# Patient Record
Sex: Female | Born: 1995 | Race: White | Hispanic: No | Marital: Single | Smoking: Never smoker
Health system: Southern US, Community
[De-identification: ages and names within clinical notes are randomized; demographics above are authoritative.]

---
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
## 2014-04-06 ENCOUNTER — Encounter: Payer: BC Managed Care – HMO | Admitting: Internal Medicine

## 2014-09-06 ENCOUNTER — Other Ambulatory Visit: Payer: Self-pay | Admitting: Obstetrics and Gynecology

## 2014-09-07 ENCOUNTER — Other Ambulatory Visit: Payer: Self-pay | Admitting: Neurosurgery

## 2014-09-07 DIAGNOSIS — M545 Low back pain: Secondary | ICD-10-CM

## 2014-09-18 ENCOUNTER — Ambulatory Visit
Admission: RE | Admit: 2014-09-18 | Discharge: 2014-09-18 | Disposition: A | Discharge status: Home / Self Care | Payer: BLUE CROSS/BLUE SHIELD | Source: Ambulatory Visit | Attending: Neurosurgery | Admitting: Neurosurgery

## 2014-09-18 DIAGNOSIS — M545 Low back pain: Secondary | ICD-10-CM

## 2015-04-10 ENCOUNTER — Ambulatory Visit
Admission: RE | Admit: 2015-04-10 | Discharge: 2015-04-10 | Disposition: A | Discharge status: Home / Self Care | Payer: BLUE CROSS/BLUE SHIELD | Source: Ambulatory Visit | Attending: Family Medicine | Admitting: Family Medicine

## 2015-04-10 ENCOUNTER — Other Ambulatory Visit: Payer: Self-pay | Admitting: Family Medicine

## 2015-04-10 DIAGNOSIS — R05 Cough: Secondary | ICD-10-CM

## 2015-04-10 DIAGNOSIS — R059 Cough, unspecified: Secondary | ICD-10-CM

## 2015-09-04 ENCOUNTER — Encounter (HOSPITAL_COMMUNITY): Payer: Self-pay | Admitting: Emergency Medicine

## 2015-09-04 ENCOUNTER — Emergency Department (HOSPITAL_COMMUNITY)
Admission: EM | Admit: 2015-09-04 | Discharge: 2015-09-04 | Disposition: A | Discharge status: Home / Self Care | Payer: BLUE CROSS/BLUE SHIELD | Attending: Emergency Medicine | Admitting: Emergency Medicine

## 2015-09-04 DIAGNOSIS — Z792 Long term (current) use of antibiotics: Secondary | ICD-10-CM | POA: Insufficient documentation

## 2015-09-04 DIAGNOSIS — R319 Hematuria, unspecified: Secondary | ICD-10-CM | POA: Diagnosis present

## 2015-09-04 DIAGNOSIS — Z79899 Other long term (current) drug therapy: Secondary | ICD-10-CM | POA: Diagnosis not present

## 2015-09-04 DIAGNOSIS — N390 Urinary tract infection, site not specified: Secondary | ICD-10-CM | POA: Diagnosis not present

## 2015-09-04 DIAGNOSIS — R8281 Pyuria: Secondary | ICD-10-CM

## 2015-09-04 LAB — URINALYSIS, ROUTINE W REFLEX MICROSCOPIC
BILIRUBIN URINE: NEGATIVE
Glucose, UA: NEGATIVE mg/dL
Ketones, ur: NEGATIVE mg/dL
Nitrite: POSITIVE — AB
Protein, ur: NEGATIVE mg/dL
SPECIFIC GRAVITY, URINE: 1.023 (ref 1.005–1.030)
pH: 7 (ref 5.0–8.0)

## 2015-09-04 LAB — URINE MICROSCOPIC-ADD ON

## 2015-09-04 LAB — WET PREP, GENITAL
CLUE CELLS WET PREP: NONE SEEN
Sperm: NONE SEEN
Trich, Wet Prep: NONE SEEN
YEAST WET PREP: NONE SEEN

## 2015-09-04 LAB — PREGNANCY, URINE: PREG TEST UR: NEGATIVE

## 2015-09-04 MED ORDER — CEPHALEXIN 500 MG PO CAPS
500.0000 mg | ORAL_CAPSULE | Freq: Once | ORAL | Status: AC
  Administered 2015-09-04: 500 mg via ORAL
  Filled 2015-09-04: qty 1

## 2015-09-04 MED ORDER — CEPHALEXIN 500 MG PO CAPS: 500.0000 mg | ORAL_CAPSULE | Freq: Two times a day (BID) | ORAL | Status: AC

## 2015-09-04 NOTE — Progress Notes (Signed)
Patient listed as having [employer] insurance without a pcp.  EDCM spoke to patient at bedside.  Patient reports she is from [place].  Patient reports she does not have a pcp and will be going out of the country within a week and then [place].  EDCM instructed patient to contact her insurance company before/when traveling to be informed of what type of medical coverage will be covered.  Patient verbalized understanding.  No further EDCM needs at this time.

## 2015-09-04 NOTE — Discharge Instructions (Signed)
Your urine today did not show any blood. It is possible, however, to have blood with a urinary tract infection. This is from irritation to the wall/lining of your bladder. This should resolve when your infection improves. You are being treated for a urinary tract infection today. Take Keflex as prescribed every day until finished. You have also been tested for STDs. Follow-up with the health department or this hospital in 48 hours by phone to obtain the results of these tests. Do not engage in sexual intercourse until you are notified of your test results. If you are positive for STDs, you must refrain from sexual intercourse for one week following treatment. If positive, you should also notify all sexual partners of the need for them to be tested and treated for STDs as well. Return to the emergency department as needed if symptoms worsen.  Urinary Tract Infection Urinary tract infections (UTIs) can develop anywhere along your urinary tract. Your urinary tract is your body's drainage system for removing wastes and extra water. Your urinary tract includes two kidneys, two ureters, a bladder, and a urethra. Your kidneys are a pair of bean-shaped organs. Each kidney is about the size of your fist. They are located below your ribs, one on each side of your spine. CAUSES Infections are caused by microbes, which are microscopic organisms, including fungi, viruses, and bacteria. These organisms are so small that they can only be seen through a microscope. Bacteria are the microbes that most commonly cause UTIs. SYMPTOMS  Symptoms of UTIs may vary by age and gender of the patient and by the location of the infection. Symptoms in young women typically include a frequent and intense urge to urinate and a painful, burning feeling in the bladder or urethra during urination. Older women and men are more likely to be tired, shaky, and weak and have muscle aches and abdominal pain. A fever may mean the infection is in your  kidneys. Other symptoms of a kidney infection include pain in your back or sides below the ribs, nausea, and vomiting. DIAGNOSIS To diagnose a UTI, your caregiver will ask you about your symptoms. Your caregiver will also ask you to provide a urine sample. The urine sample will be tested for bacteria and white blood cells. White blood cells are made by your body to help fight infection. TREATMENT  Typically, UTIs can be treated with medication. Because most UTIs are caused by a bacterial infection, they usually can be treated with the use of antibiotics. The choice of antibiotic and length of treatment depend on your symptoms and the type of bacteria causing your infection. HOME CARE INSTRUCTIONS  If you were prescribed antibiotics, take them exactly as your caregiver instructs you. Finish the medication even if you feel better after you have only taken some of the medication.  Drink enough water and fluids to keep your urine clear or pale yellow.  Avoid caffeine, tea, and carbonated beverages. They tend to irritate your bladder.  Empty your bladder often. Avoid holding urine for long periods of time.  Empty your bladder before and after sexual intercourse.  After a bowel movement, women should cleanse from front to back. Use each tissue only once. SEEK MEDICAL CARE IF:   You have back pain.  You develop a fever.  Your symptoms do not begin to resolve within 3 days. SEEK IMMEDIATE MEDICAL CARE IF:   You have severe back pain or lower abdominal pain.  You develop chills.  You have nausea or vomiting.  You have continued burning or discomfort with urination. MAKE SURE YOU:   Understand these instructions.  Will watch your condition.  Will get help right away if you are not doing well or get worse.   This information is not intended to replace advice given to you by your health care provider. Make sure you discuss any questions you have with your health care provider.     Document Released: 01/14/2005 Document Revised: 12/26/2014 Document Reviewed: 05/15/2011 Elsevier Interactive Patient Education [employer]2016 [employer]

## 2015-09-04 NOTE — ED Notes (Signed)
Patient presents for hematuria. Seen by PCP and started on cipro for possible kidney infection. Reports today large amounts of blood in urine. Denies flank pain, fever.

## 2015-09-05 LAB — GC/CHLAMYDIA PROBE AMP (~~LOC~~) NOT AT ARMC
Chlamydia: NEGATIVE
NEISSERIA GONORRHEA: NEGATIVE

## 2015-09-05 NOTE — ED Provider Notes (Signed)
CSN: [id number]     Arrival date & time 09/04/15  1951 History   First MD Initiated Contact with Patient 09/04/15 2116     Chief Complaint  Patient presents with  . Hematuria     (Consider location/radiation/quality/duration/timing/severity/associated sxs/prior Treatment) HPI Comments: 20 year old female presents to the emergency department for evaluation of hematuria. She states that she was seen 4 days ago for some bilateral lower back pain. She was started on ciprofloxacin for potential kidney infection by her primary care doctor. She states that she took this antibiotic for 2 days and discontinued after resolution of symptoms. She started to notice some dysuria today without abdominal pain. This progressed to dark-colored urine with presumed hematuria at about 1700. She states that symptoms have continued to worsen. She states that her father gave her some tablets of medication to take today for a urinary tract infection. She states that this medication was not an antibiotic. She is not sure of whether she took any Azo tablets. She does report unprotected sexual intercourse at times. She has had multiple sexual partners. She denies any abdominal pain, fever, vomiting, vaginal bleeding, or vaginal discharge. No history of abdominal surgeries. She denies frequent UTIs.  Patient is a 20 y.o. female presenting with hematuria. The history is provided by the patient. No language interpreter was used.  Hematuria    History reviewed. No pertinent past medical history. History reviewed. No pertinent past surgical history. No family history on file. Social History  Substance Use Topics  . Smoking status: Never Smoker   . Smokeless tobacco: None  . Alcohol Use: Yes   OB History    No data available      Review of Systems  Genitourinary: Positive for dysuria and hematuria.  Musculoskeletal: Positive for back pain (resolved).  All other systems reviewed and are negative.   Allergies   Hydrocodone  Home Medications   Prior to Admission medications   Medication Sig Start Date End Date Taking? Authorizing Provider  Multiple Vitamins-Minerals (WOMENS MULTIVITAMIN PLUS PO) Take 1 tablet by mouth daily.   Yes Historical Provider, MD  norgestimate-ethinyl estradiol (ORTHO-CYCLEN,SPRINTEC,PREVIFEM) 0.25-35 MG-MCG tablet Take 1 tablet by mouth every morning.    Yes Historical Provider, MD  cephALEXin (KEFLEX) 500 MG capsule Take 1 capsule (500 mg total) by mouth 2 (two) times daily. Take as prescribed until finished 09/04/15   Antony MaduraKelly Brenda Samano, PA-C   BP 123/75 mmHg  Pulse 61  Temp(Src) 98.7 F (37.1 C) (Oral)  Resp 18  SpO2 99%  LMP 08/21/2015 (Approximate)   Physical Exam  Constitutional: She is oriented to person, place, and time. She appears well-developed and well-nourished. No distress.  Nontoxic appearing  HENT:  Head: Normocephalic and atraumatic.  Eyes: Conjunctivae and EOM are normal. No scleral icterus.  Neck: Normal range of motion.  Cardiovascular: Normal rate, regular rhythm and intact distal pulses.   Pulmonary/Chest: Effort normal. No respiratory distress. She has no wheezes.  Respirations even and unlabored  Abdominal: Soft. She exhibits no distension. There is no tenderness. There is no rebound and no guarding.  Soft, nontender abdomen  Genitourinary: There is no rash, tenderness, lesion or injury on the right labia. There is no rash, tenderness, lesion or injury on the left labia. Uterus is not tender. Cervix exhibits no motion tenderness and no friability. Right adnexum displays no mass, no tenderness and no fullness. Left adnexum displays no mass, no tenderness and no fullness. No bleeding in the vagina. Vaginal discharge (small-mod white discharge) found.  No cervical motion or adnexal tenderness.  Musculoskeletal: Normal range of motion.  Neurological: She is alert and oriented to person, place, and time. She exhibits normal muscle tone. Coordination  normal.  Patient moving all extremities  Skin: Skin is warm and dry. No rash noted. She is not diaphoretic. No erythema. No pallor.  Psychiatric: She has a normal mood and affect. Her behavior is normal.  Nursing note and vitals reviewed.   ED Course  Procedures (including critical care time) Labs Review Labs Reviewed  WET PREP, GENITAL - Abnormal; Notable for the following:    WBC, Wet Prep HPF POC MANY (*)    All other components within normal limits  URINALYSIS, ROUTINE W REFLEX MICROSCOPIC (NOT AT [facility]) - Abnormal; Notable for the following:    Color, Urine ORANGE (*)    APPearance CLOUDY (*)    Hgb urine dipstick TRACE (*)    Nitrite POSITIVE (*)    Leukocytes, UA MODERATE (*)    All other components within normal limits  URINE MICROSCOPIC-ADD ON - Abnormal; Notable for the following:    Squamous Epithelial / LPF 0-5 (*)    Bacteria, UA FEW (*)    All other components within normal limits  URINE CULTURE  PREGNANCY, URINE  GC/CHLAMYDIA PROBE AMP ([place]) NOT AT [facility]    Imaging Review No results found.   I have personally reviewed and evaluated these images and lab results as part of my medical decision-making.   EKG Interpretation None      MDM   Final diagnoses:  Pyuria    20 year old female presents to the emergency department for presumed hematuria. Her urinalysis today does not reveal any red blood cells. Question whether color change may be due to Azo tablets taken prior to arrival. She states that her father gave her some medication to take, but she is unsure of what this was. Her urine was orange in color. This may cause a false positive nitrite result. Urinalysis shows few bacteria. There are too numerous to count white blood cells. Given history of unprotected sexual intercourse, question pyuria secondary to STDs. A pelvic exam was completed which is also positive for many white blood cells. No Trichomonas, clue cells, or yeast. Pregnancy is  negative.  Urine culture ordered. Given proceeding back pain with mild dysuria, will cover for UTI with Keflex. I have advised that the patient follow-up in 48 hours regarding the results of her STD tests. She has no cervical motion tenderness or adnexal tenderness today. No fever. Low suspicion for TOA at this time. Return precautions discussed and provided. Patient agreeable to plan with known address concerns. Patient discharged in good condition; vital signs stable.   Filed Vitals:   09/04/15 2012 09/04/15 2218 09/04/15 2317  BP: 135/63 124/70 123/75  Pulse: 68 61 61  Temp: 98.8 F (37.1 C)  98.7 F (37.1 C)  TempSrc: Oral  Oral  Resp: 17  18  SpO2: 95% 100% 99%      Antony Madura, PA-C 09/05/15 0236  Benjiman Core, MD 09/07/15 1521

## 2015-09-07 LAB — URINE CULTURE: Culture: >=100000 — AB

## 2016-08-22 IMAGING — CR DG CHEST 2V
2 series · 2 of 2 positions shown · non-contrast
Comparison: None in PACs

CLINICAL DATA: Productive cough and dyspnea on exertion, episodic
left anterior sharp stab pain, duration of symptoms 1 month,
abnormal chest exam in the left lower lobe, nonsmoker.

EXAM:
CHEST  2 VIEW

[w chest pa]
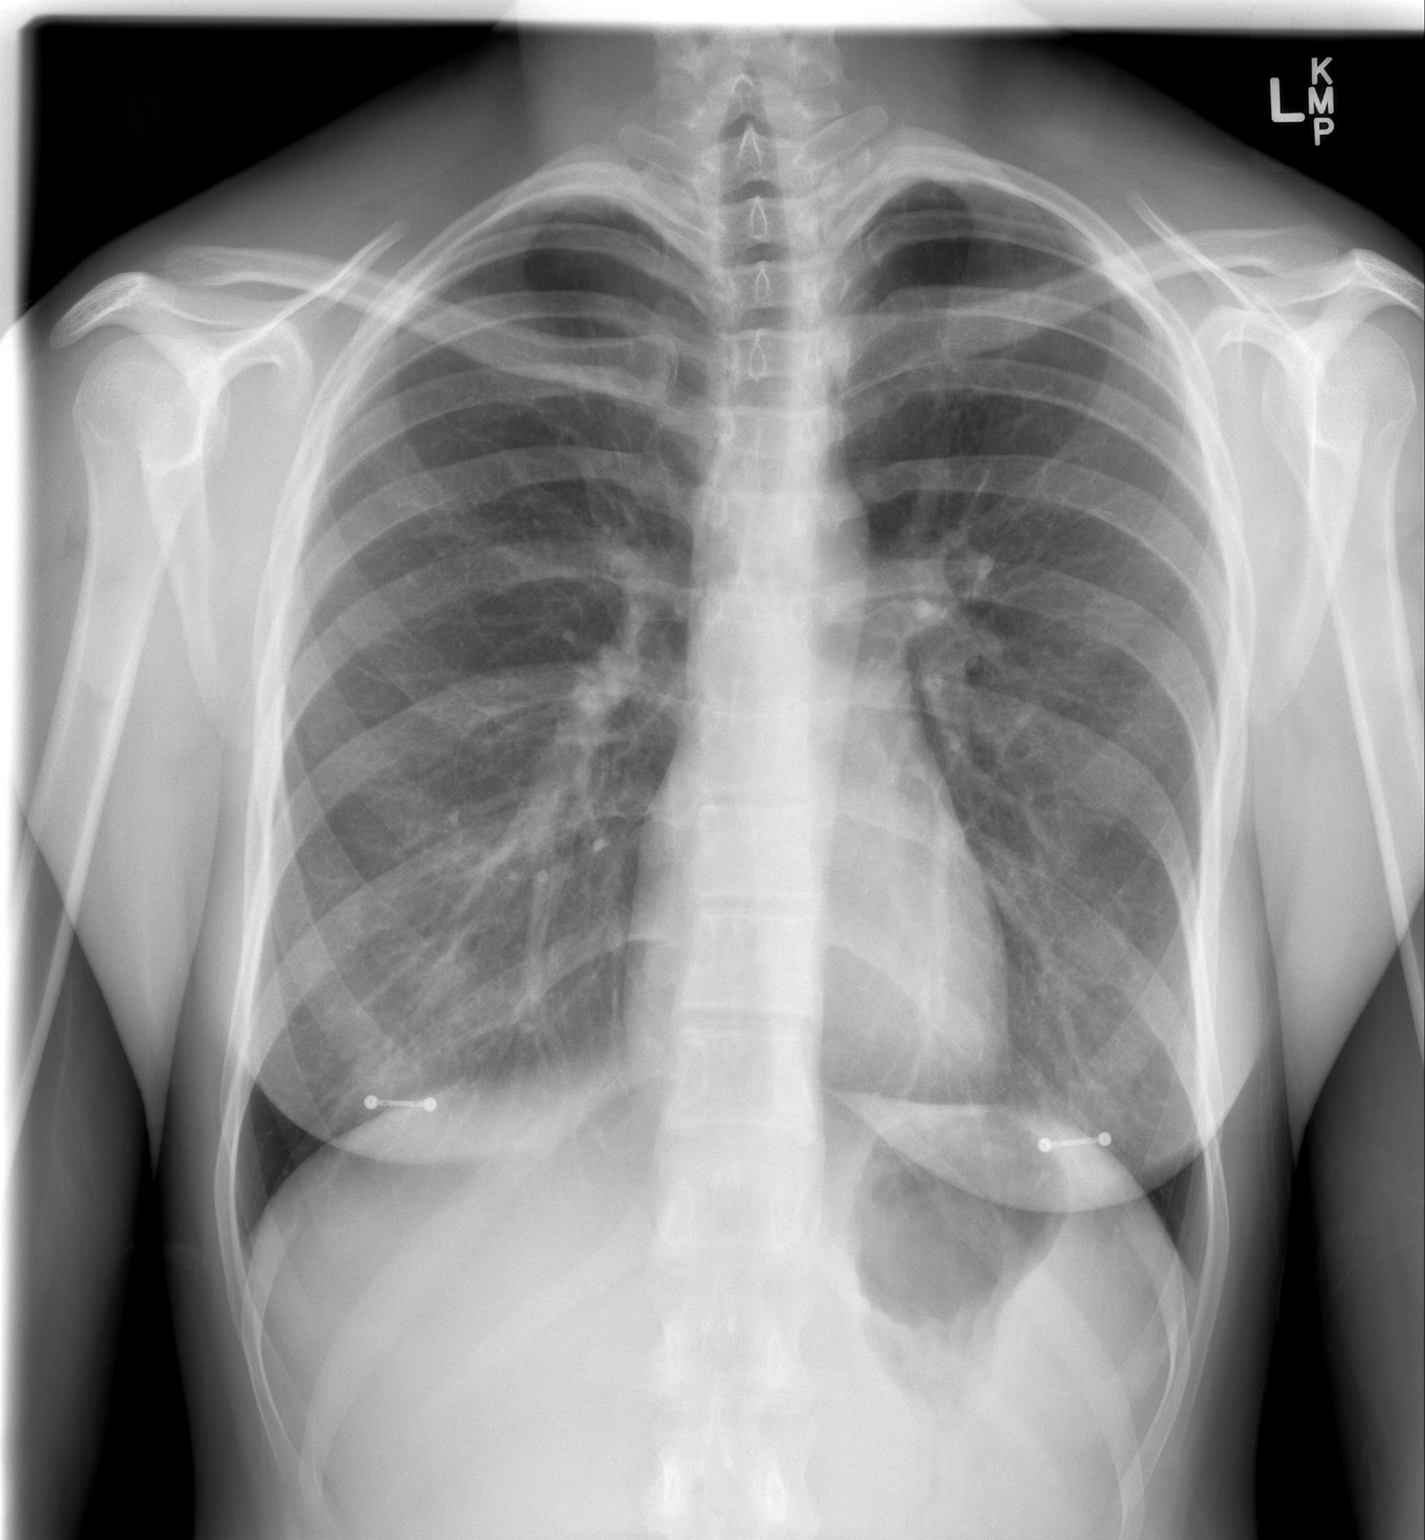

[w chest lat]
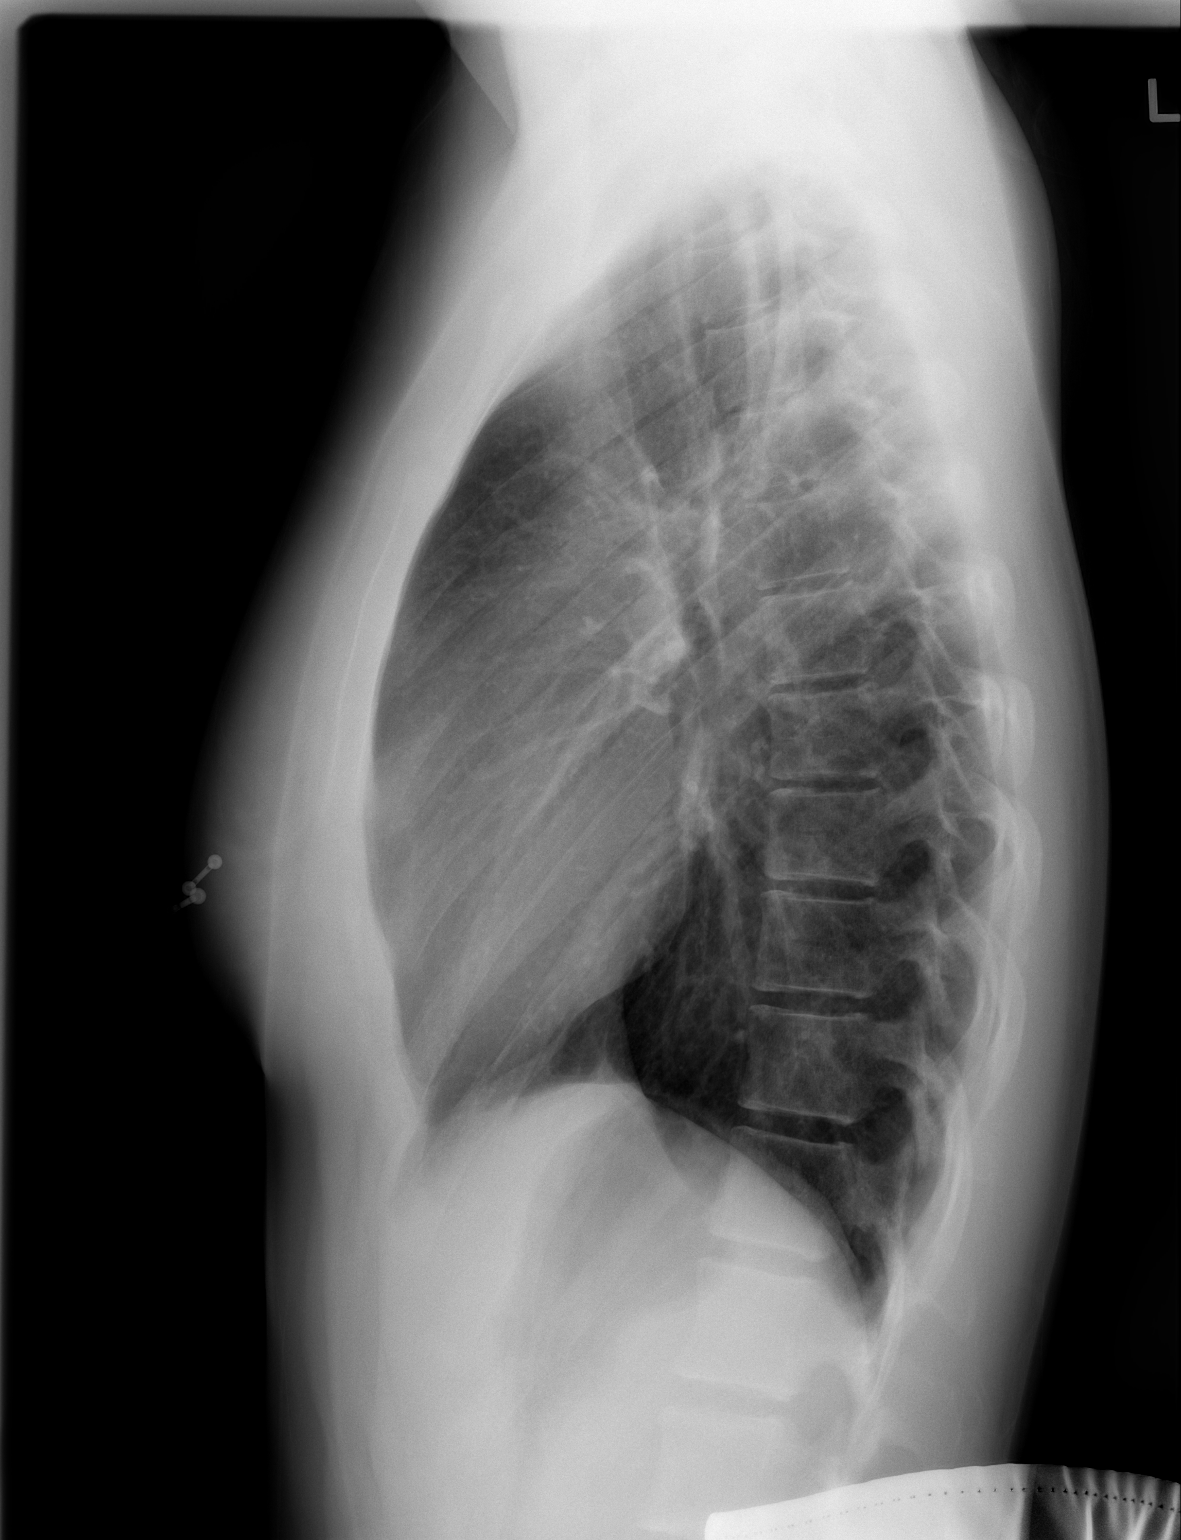

[2 of 2 positions shown; findings below may reference images not displayed]

FINDINGS: The lungs are mildly hyperinflated. There is no focal infiltrate.
There is no pleural effusion. The heart and pulmonary vascularity
are normal. The mediastinum is normal in width. The bony thorax is
unremarkable.
IMPRESSION: Mild hyperinflation may be voluntary but could reflect reactive
airway disease/acute bronchitis. There is no evidence of pneumonia,
CHF, nor other acute cardiopulmonary disease.
